# Patient Record
Sex: Male | Born: 1970 | Race: White | Hispanic: No
Health system: Southern US, Community
[De-identification: ages and names within clinical notes are randomized; demographics above are authoritative.]

---

## 2012-01-25 ENCOUNTER — Emergency Department: Payer: Self-pay | Admitting: Emergency Medicine

## 2020-04-13 ENCOUNTER — Ambulatory Visit: Payer: Self-pay | Attending: Internal Medicine

## 2020-04-13 DIAGNOSIS — Z23 Encounter for immunization: Secondary | ICD-10-CM

## 2020-04-13 NOTE — Progress Notes (Signed)
   Covid-19 Vaccination Clinic  Name:  Seth Clark    MRN: [id number] DOB: [birth year]  04/13/2020  [name] was observed post Covid-19 immunization for 15 minutes without incident. He was provided with Vaccine Information Sheet and instruction to access the V-Safe system.   [name] was instructed to call 911 with any severe reactions post vaccine: Marland Kitchen Difficulty breathing  . Swelling of face and throat  . A fast heartbeat  . A bad rash all over body  . Dizziness and weakness   Immunizations Administered    Name Date Dose VIS Date Route   Pfizer COVID-19 Vaccine 04/13/2020  9:02 AM 0.3 mL 02/16/2019 Intramuscular   Manufacturer: [employer], [employer]   Lot: [id number]   NDC: [address]-7

## 2020-05-09 ENCOUNTER — Ambulatory Visit: Payer: Self-pay | Attending: Internal Medicine

## 2021-07-26 ENCOUNTER — Emergency Department
Admission: EM | Admit: 2021-07-26 | Discharge: 2021-07-26 | Disposition: A | Discharge status: Home / Self Care | Payer: Self-pay | Attending: Emergency Medicine | Admitting: Emergency Medicine

## 2021-07-26 ENCOUNTER — Other Ambulatory Visit: Payer: Self-pay

## 2021-07-26 ENCOUNTER — Emergency Department: Payer: Self-pay

## 2021-07-26 DIAGNOSIS — Z20822 Contact with and (suspected) exposure to covid-19: Secondary | ICD-10-CM | POA: Insufficient documentation

## 2021-07-26 DIAGNOSIS — F17210 Nicotine dependence, cigarettes, uncomplicated: Secondary | ICD-10-CM | POA: Insufficient documentation

## 2021-07-26 DIAGNOSIS — J069 Acute upper respiratory infection, unspecified: Secondary | ICD-10-CM | POA: Insufficient documentation

## 2021-07-26 DIAGNOSIS — J209 Acute bronchitis, unspecified: Secondary | ICD-10-CM | POA: Insufficient documentation

## 2021-07-26 LAB — RESP PANEL BY RT-PCR (FLU A&B, COVID) ARPGX2
Influenza A by PCR: NEGATIVE
Influenza B by PCR: NEGATIVE
SARS Coronavirus 2 by RT PCR: NEGATIVE

## 2021-07-26 MED ORDER — AZITHROMYCIN 250 MG PO TABS: 250.0000 mg | ORAL_TABLET | Freq: Every day | ORAL | 0 refills | Status: DC

## 2021-07-26 MED ORDER — AZITHROMYCIN 250 MG PO TABS: 250.0000 mg | ORAL_TABLET | Freq: Every day | ORAL | 0 refills | Status: AC

## 2021-07-26 MED ORDER — AZITHROMYCIN 500 MG PO TABS
500.0000 mg | ORAL_TABLET | Freq: Once | ORAL | Status: AC
  Administered 2021-07-26: 500 mg via ORAL
  Filled 2021-07-26: qty 1

## 2021-07-26 NOTE — ED Provider Notes (Signed)
[facility] Emergency Department Provider Note ____________________________________________  Time seen: 1232  I have reviewed the triage vital signs and the nursing notes.  HISTORY  Chief Complaint  Fever and Cough   HPI Seth Clark is a 50 y.o. male presents self to the ED for evaluation of cough and fever for the last week.  Patient also describes decreased appetite and at same time.  He reports T-max at home 101.8 F.  Patient has taken 3 Tylenol today, prior to arrival.  He denies any diarrhea, belly pain, chest pain, or shortness of breath.  History reviewed. No pertinent past medical history.  There are no problems to display for this patient.   History reviewed. No pertinent surgical history.  Prior to Admission medications   Medication Sig Start Date End Date Taking? Authorizing Provider  azithromycin (ZITHROMAX Z-PAK) 250 MG tablet Take 1 tablet (250 mg total) by mouth daily for 4 days. 07/27/21 07/31/21  Seth Clark, Seth Ivory, PA-C    Allergies Patient has no allergy information on record.  History reviewed. No pertinent family history.  Social History Social History   Tobacco Use   Smoking status: Every Day    Types: Cigarettes   Smokeless tobacco: Never  Substance Use Topics   Alcohol use: Yes   Drug use: Yes    Types: Marijuana    Review of Systems  Constitutional: Positive for fever. Eyes: Negative for visual changes. ENT: Negative for sore throat. Cardiovascular: Negative for chest pain. Respiratory: Negative for shortness of breath.  Reports cough Gastrointestinal: Negative for abdominal pain, vomiting and diarrhea. Genitourinary: Negative for dysuria. Musculoskeletal: Negative for back pain. Skin: Negative for rash. Neurological: Negative for headaches, focal weakness or numbness. ____________________________________________  PHYSICAL EXAM:  VITAL SIGNS: ED Triage Vitals  Enc Vitals Group     BP 07/26/21 1057 (!)  121/92     Pulse Rate 07/26/21 1057 (!) 113     Resp 07/26/21 1057 18     Temp 07/26/21 1057 98.4 F (36.9 C)     Temp Source 07/26/21 1057 Oral     SpO2 07/26/21 1057 97 %     Weight 07/26/21 1058 175 lb (79.4 kg)     Height 07/26/21 1058 6' (1.829 m)     Head Circumference --      Peak Flow --      Pain Score 07/26/21 1058 0     Pain Loc --      Pain Edu? --      Excl. in GC? --     Constitutional: Alert and oriented. Well appearing and in no distress. Head: Normocephalic and atraumatic. Eyes: Conjunctivae are normal. Normal extraocular movements Ears: Canals clear. TMs intact bilaterally. Nose: No congestion/rhinorrhea/epistaxis. Mouth/Throat: Mucous membranes are moist. Cardiovascular: Normal rate, regular rhythm. Normal distal pulses. Respiratory: Normal respiratory effort. No wheezes/rales/rhonchi. Gastrointestinal: Soft and nontender. No distention. Musculoskeletal: Nontender with normal range of motion in all extremities.  Neurologic:  Normal gait without ataxia. Normal speech and language. No gross focal neurologic deficits are appreciated. Skin:  Skin is warm, dry and intact. No rash noted. Psychiatric: Mood and affect are normal. Patient exhibits appropriate insight and judgment. ____________________________________________   LABS (pertinent positives/negatives) Labs Reviewed  RESP PANEL BY RT-PCR (FLU A&B, COVID) [id number]    ____________________________________________  {EKG  ____________________________________________   RADIOLOGY  Official radiology report(s): DG Chest 2 View  Result Date: 07/26/2021 CLINICAL DATA:  Cough, fever and body aches for approximately 7 days. EXAM: CHEST -  2 VIEW COMPARISON:  None. FINDINGS: Lungs clear. Heart size normal. No pneumothorax or pleural fluid. No bony abnormality. IMPRESSION: Normal chest. Electronically Signed   By: Seth Clark M.D.   On: 07/26/2021 14:48    ____________________________________________  PROCEDURES  Azithromycin 500 mg PO  Procedures ____________________________________________   INITIAL IMPRESSION / ASSESSMENT AND PLAN / ED COURSE  As part of my medical decision making, I reviewed the following data within the electronic MEDICAL RECORD NUMBER Labs reviewed WNL, Radiograph reviewed WNL, and Notes from prior ED visits    DDX: viral URI, Covid, influenza, CAP    ED evaluation of 1 week of intermittent fevers and cough.  Evaluated in the ED for his complaints, and found to have no evidence of CAP or evidence of infection with influenza or COVID.  Clinically the patient is stable with normalizing heart rate and has been afebrile throughout his course.  He will be treated empirically for an infectious pneumonia with a course of azithromycin.  Patient will continue to monitor and treat any fevers, hydrate, and rest as appropriate.  Work note is provided with benefit.  Return precautions have been discussed.  Seth Clark was evaluated in Emergency Department on 07/27/2021 for the symptoms described in the history of present illness. He was evaluated in the context of the global COVID-19 pandemic, which necessitated consideration that the patient might be at risk for infection with the SARS-CoV-2 virus that causes COVID-19. Institutional protocols and algorithms that pertain to the evaluation of patients at risk for COVID-19 are in a state of rapid change based on information released by regulatory bodies including the CDC and federal and state organizations. These policies and algorithms were followed during the patient's care in the ED. ____________________________________________  FINAL CLINICAL IMPRESSION(S) / ED DIAGNOSES  Final diagnoses:  Viral URI with cough  Acute bronchitis, unspecified organism      Seth Hoard, PA-C 07/27/21 1128    Seth Katos, MD 07/27/21 1524

## 2021-07-26 NOTE — Discharge Instructions (Addendum)
Your exam and viral test are negative at this time.  Take the antibiotic as directed.  Consider taking over-the-counter cough medicine for cough relief.  Follow-up with primary provider or [facility] as needed peer return to the ED if needed.

## 2021-07-26 NOTE — ED Triage Notes (Signed)
Pt arrived to ed via pov from home w/ c/o fever and cough , decreased appetite over last week. Pt reports highest temp at home was 101.8. pt report taking 3 tylenol unknown mg. NAD noted at this time

## 2021-07-26 NOTE — ED Notes (Signed)
See triage note  Presents with fever,h/a and body aches    States sx's started about 7 days ago  Fever at home was 101  Pt is currently afebrile

## 2022-07-21 ENCOUNTER — Encounter: Payer: Self-pay | Admitting: Emergency Medicine

## 2022-07-21 ENCOUNTER — Emergency Department
Admission: EM | Admit: 2022-07-21 | Discharge: 2022-07-21 | Disposition: A | Discharge status: Home / Self Care | Payer: Self-pay | Attending: Emergency Medicine | Admitting: Emergency Medicine

## 2022-07-21 ENCOUNTER — Emergency Department: Payer: Self-pay

## 2022-07-21 ENCOUNTER — Other Ambulatory Visit: Payer: Self-pay

## 2022-07-21 DIAGNOSIS — T148XXA Other injury of unspecified body region, initial encounter: Secondary | ICD-10-CM

## 2022-07-21 DIAGNOSIS — X58XXXA Exposure to other specified factors, initial encounter: Secondary | ICD-10-CM | POA: Insufficient documentation

## 2022-07-21 DIAGNOSIS — S300XXA Contusion of lower back and pelvis, initial encounter: Secondary | ICD-10-CM | POA: Insufficient documentation

## 2022-07-21 LAB — COMPREHENSIVE METABOLIC PANEL
ALT: 39 U/L (ref 0–44)
AST: 39 U/L (ref 15–41)
Albumin: 4.3 g/dL (ref 3.5–5.0)
Alkaline Phosphatase: 49 U/L (ref 38–126)
Anion gap: 5 (ref 5–15)
BUN: 13 mg/dL (ref 6–20)
CO2: 25 mmol/L (ref 22–32)
Calcium: 8.6 mg/dL — ABNORMAL LOW (ref 8.9–10.3)
Chloride: 106 mmol/L (ref 98–111)
Creatinine, Ser: 0.84 mg/dL (ref 0.61–1.24)
GFR, Estimated: >60 mL/min (ref >60)
Glucose, Bld: 111 mg/dL — ABNORMAL HIGH (ref 70–99)
Potassium: 4.3 mmol/L (ref 3.5–5.1)
Sodium: 136 mmol/L (ref 135–145)
Total Bilirubin: 0.8 mg/dL (ref 0.3–1.2)
Total Protein: 7.4 g/dL (ref 6.5–8.1)

## 2022-07-21 LAB — URINALYSIS, ROUTINE W REFLEX MICROSCOPIC
Bilirubin Urine: NEGATIVE
Glucose, UA: NEGATIVE mg/dL
Hgb urine dipstick: NEGATIVE
Ketones, ur: NEGATIVE mg/dL
Leukocytes,Ua: NEGATIVE
Nitrite: NEGATIVE
Protein, ur: NEGATIVE mg/dL
Specific Gravity, Urine: 1.019 (ref 1.005–1.030)
pH: 7 (ref 5.0–8.0)

## 2022-07-21 LAB — PROTIME-INR
INR: 1 (ref 0.8–1.2)
Prothrombin Time: 12.8 seconds (ref 11.4–15.2)

## 2022-07-21 LAB — CBC WITH DIFFERENTIAL/PLATELET
Abs Immature Granulocytes: 0.02 10*3/uL (ref 0.00–0.07)
Basophils Absolute: 0.1 10*3/uL (ref 0.0–0.1)
Basophils Relative: 1 %
Eosinophils Absolute: 0 10*3/uL (ref 0.0–0.5)
Eosinophils Relative: 0 %
HCT: 45.6 % (ref 39.0–52.0)
Hemoglobin: 15 g/dL (ref 13.0–17.0)
Immature Granulocytes: 0 %
Lymphocytes Relative: 26 %
Lymphs Abs: 2.4 10*3/uL (ref 0.7–4.0)
MCH: 30.9 pg (ref 26.0–34.0)
MCHC: 32.9 g/dL (ref 30.0–36.0)
MCV: 94 fL (ref 80.0–100.0)
Monocytes Absolute: 0.6 10*3/uL (ref 0.1–1.0)
Monocytes Relative: 6 %
Neutro Abs: 6.1 10*3/uL (ref 1.7–7.7)
Neutrophils Relative %: 67 %
Platelets: 249 10*3/uL (ref 150–400)
RBC: 4.85 MIL/uL (ref 4.22–5.81)
RDW: 12.4 % (ref 11.5–15.5)
WBC: 9.2 10*3/uL (ref 4.0–10.5)
nRBC: 0 % (ref 0.0–0.2)

## 2022-07-21 LAB — SAMPLE TO BLOOD BANK

## 2022-07-21 LAB — LACTIC ACID, PLASMA: Lactic Acid, Venous: 1 mmol/L (ref 0.5–1.9)

## 2022-07-21 MED ORDER — IOHEXOL 300 MG/ML  SOLN
100.0000 mL | Freq: Once | INTRAMUSCULAR | Status: AC | PRN
  Administered 2022-07-21: 100 mL via INTRAVENOUS

## 2022-07-21 MED ORDER — IOHEXOL 350 MG/ML SOLN: 100.0000 mL | Freq: Once | INTRAVENOUS | Status: DC | PRN

## 2022-07-21 NOTE — ED Provider Notes (Signed)
I assumed care of this patient approximately 1500.  Please see outgoing providers note for full details guarding patient's initial evaluation assessment.  In brief patient presents for evaluation of 2 days of worsening achiness and some skin discoloration in his lower bilateral buttocks region extending towards his perineal region.  He is afebrile hemodynamically stable on arrival.  No history of recent trauma although he did have recent intercourse.  At time of signout he has labs obtained including a UA that this shows no evidence of infection, CBC without leukocytosis or acute anemia normal platelets, nonelevated lactic acid, and CMP without any significant lecture light or metabolic derangements.  He is pending a CT pelvis.  Clinically findings are concerning for hematoma likely from a ruptured vein and possibly a skin tear.  CT pelvis shows some soft tissue edema and stranding in the right inferomedial buttocks and perineum without any evidence of fluid collection.  There is no gas.  Somewhat nonspecific.  There is evidence of a small left hydrocele per radiology and some diffuse bladder stranding which is nonspecific and overall given on remarkable UA I do not think represents an clinically significant cystitis.  I suspect based on history and based on my exam with the patient showing what appears to be a large hematoma patient had spontaneous vessel rupture possibly with a muscle strain.  We will mark the area today with a marking pen.  Advised pelvic rest for close outpatient PCP follow-up.  Discussed returning for any increase in size or new symptoms.  He is agreeable with plan.  Discharged in stable condition.  Strict return precautions advised and discussed.    Gilles Chiquito, MD 07/21/22 [phone]

## 2022-07-21 NOTE — ED Triage Notes (Signed)
Pt via POV from home. Pt c/o a hematoma and bruising that spans from his buttocks, rectum into his scrotum and groin this AM. Denies any injury. States that he does have some burning when he pees and swelling in his testicles that started today. States that yesterday he felt a small "lump" on his rectum. Denies any blood thinners. Pt is A&OX4 and NAD

## 2022-07-21 NOTE — ED Provider Notes (Signed)
[facility] Provider Note    Event Date/Time   First MD Initiated Contact with Patient 07/21/22 1239     (approximate)   History   Abscess   HPI  Seth Clark is a 51 y.o. 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  It hurt for a few hours but then went away.  Then yesterday he and his fiance had normal intercourse after use of Viagra, he denies any injury sustained doing so.  This morning while watching videogames he notes he felt like a strange sort of slight achiness around his buttock area, he took a photo and noticed the area was quite dark or bruised.  No fevers or chills.  No nausea or vomiting.  There is no bruising or swelling of the penis or scrotum around the testicles.  Reports the bruising is just below the scrotum and runs up towards his buttock.  He also reports he felt a small area or a lump kind of near the edge of his buttock or rectum.  Denies any falls or trauma.  Does report about 2 days ago he felt like he pulled something in his groin area and it was sore but got better     Physical Exam   Triage Vital Signs: ED Triage Vitals  Enc Vitals Group     BP 07/21/22 1227 (!) 115/94     Pulse Rate 07/21/22 1227 94     Resp 07/21/22 1227 20     Temp 07/21/22 1227 98.4 F (36.9 C)     Temp src --      SpO2 07/21/22 1227 98 %     Weight 07/21/22 1226 170 lb (77.1 kg)     Height 07/21/22 1226 6\' 1"  (1.854 m)     Head Circumference --      Peak Flow --      Pain Score 07/21/22 1225 6     Pain Loc --      Pain Edu? --      Excl. in GC? --     Most recent vital signs: Vitals:   07/21/22 1227  BP: (!) 115/94  Pulse: 94  Resp: 20  Temp: 98.4 F (36.9 C)  SpO2: 98%     General: Awake, no distress.   CV:  Good peripheral perfusion.  Resp:  Normal effort.  Abd:  No distention.  Soft nontender nondistended throughout. Other:  Examined in the perineum has mild ecchymosis and also noticed his ecchymosis over the gluteal cleft bilaterally.  This is ecchymosis extends across the perineum up to the base of the scrotum but does not enter into the scrotum.  Testicles are nontender and there is no noted testicular edema.  The penis is normal, nondirect uncircumcised and atraumatic.  No bruising around the penis itself.  In essence he has what appears to be ecchymosis over his perineum.  The area is only slightly tender and perhaps a very minimally swollen.  No areas of erythema or skin breakdown.  Ecchymosis does not extend to the rectum and seems to slightly spare the gluteal cleft.   ED Results / Procedures / Treatments   Labs (all labs ordered are listed, but only abnormal results are displayed) Labs Reviewed  COMPREHENSIVE METABOLIC  PANEL - Abnormal; Notable for the following components:      Result Value   Glucose, Bld 111 (*)    Calcium 8.6 (*)    All other components within normal limits  URINALYSIS, ROUTINE W REFLEX MICROSCOPIC - Abnormal; Notable for the following components:   Color, Urine YELLOW (*)    APPearance CLEAR (*)    All other components within normal limits  CULTURE, BLOOD (ROUTINE X 2)  CULTURE, BLOOD (ROUTINE X 2)  LACTIC ACID, PLASMA  CBC WITH DIFFERENTIAL/PLATELET  PROTIME-INR  SAMPLE TO BLOOD BANK     EKG     RADIOLOGY  CT pelvis pending at time of signout   PROCEDURES:  Critical Care performed: No  Procedures   MEDICATIONS ORDERED IN ED: Medications  iohexol (OMNIPAQUE) 300 MG/ML solution 100 mL (100 mLs Intravenous Contrast Given 07/21/22 1546)     IMPRESSION / MDM / ASSESSMENT AND PLAN / ED COURSE  I reviewed the triage vital signs and the nursing notes.                              Differential diagnosis includes, but is not limited  to, possible musculoskeletal strain resulting in hematoma formation in around the perineum, abscess though this seems unlikely without fever normal white count and the clinical history given.  There is no evidence that would be suggestive of Fournier's type gangrene.  He denies any major trauma but does report a possible muscle tear occurring the day before and the swelling being in a similar area making me think possibly musculoskeletal or from a strain.  No involvement of the testicles scrotum or penis.  CT ordered to evaluate for pelvic pathology and etiology rule out possible abscess or large hematoma.  Patient's presentation is most consistent with acute complicated illness / injury requiring diagnostic workup.  Labs interpreted as normal with exception to very mild hypocalcemia.  Normal CBC normal hemoglobin normal white count.  Urinalysis without evidence of infection  Ongoing care assigned to Dr. Terrilee Files, follow-up on pending CT of the pelvis to further evaluate for etiology of bruising and swelling.  My suspicion is that this may be musculoskeletal in nature, but do wish to rule out other causes such as possible infectious, fournier, abscess, large hematoma etc.     FINAL CLINICAL IMPRESSION(S) / ED DIAGNOSES   Final diagnoses:  Bruising  Ecchymosis, perineum   Rx / DC Orders   ED Discharge Orders     None        Note:  This document was prepared using Dragon voice recognition software and may include unintentional dictation errors.   Sharyn Creamer, MD 07/21/22 1620

## 2022-07-26 LAB — CULTURE, BLOOD (ROUTINE X 2)
Culture: NO GROWTH
Special Requests: ADEQUATE

## 2022-09-11 IMAGING — CR DG CHEST 2V
1 series · 2 of 2 positions shown · non-contrast
Comparison: None.

CLINICAL DATA: Cough, fever and body aches for approximately 7
days.

EXAM:
CHEST - 2 VIEW

[Series 1: dg chest 2 view · 0.14mm/px · 2 of 2 slices shown]
[im 1/2]
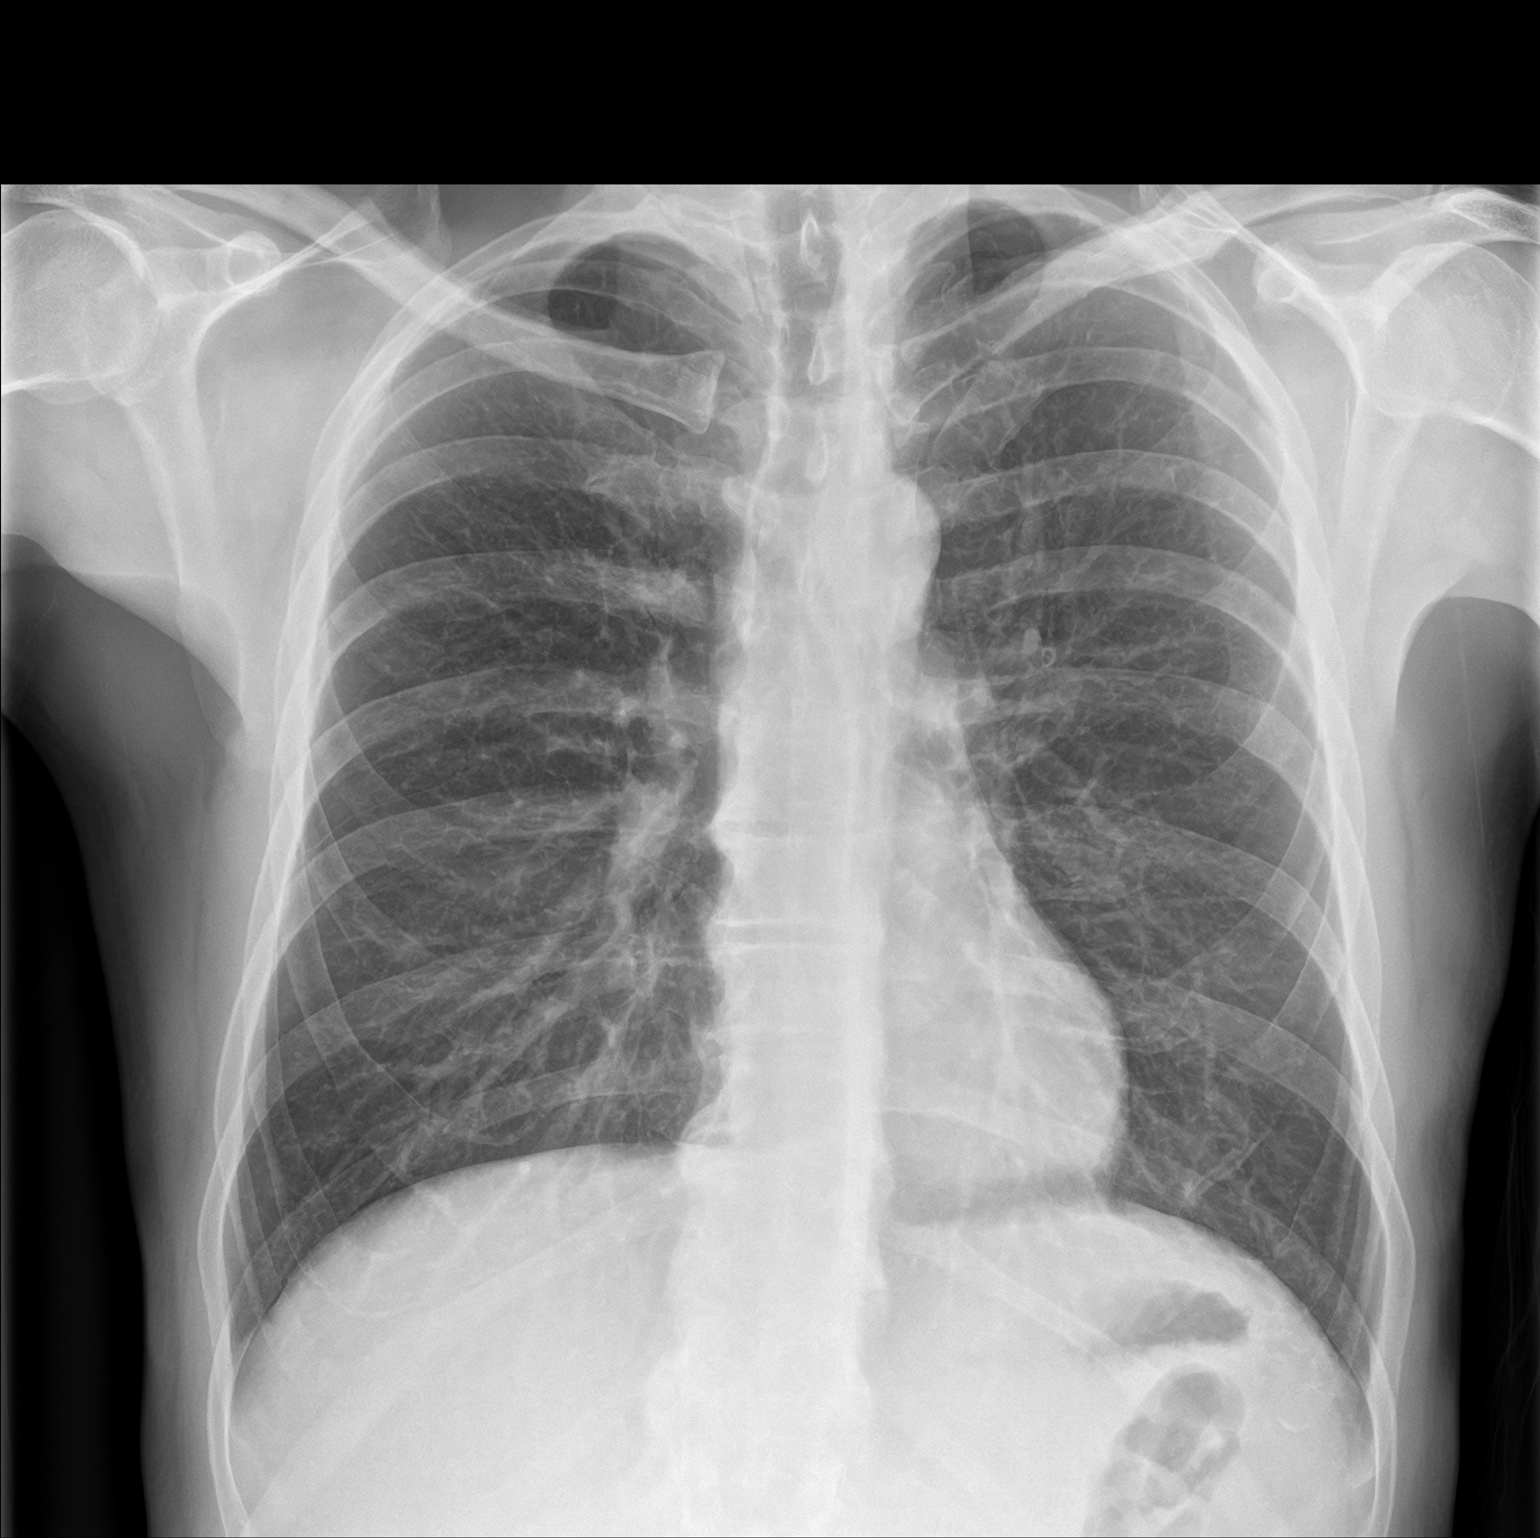
[im 2/2]
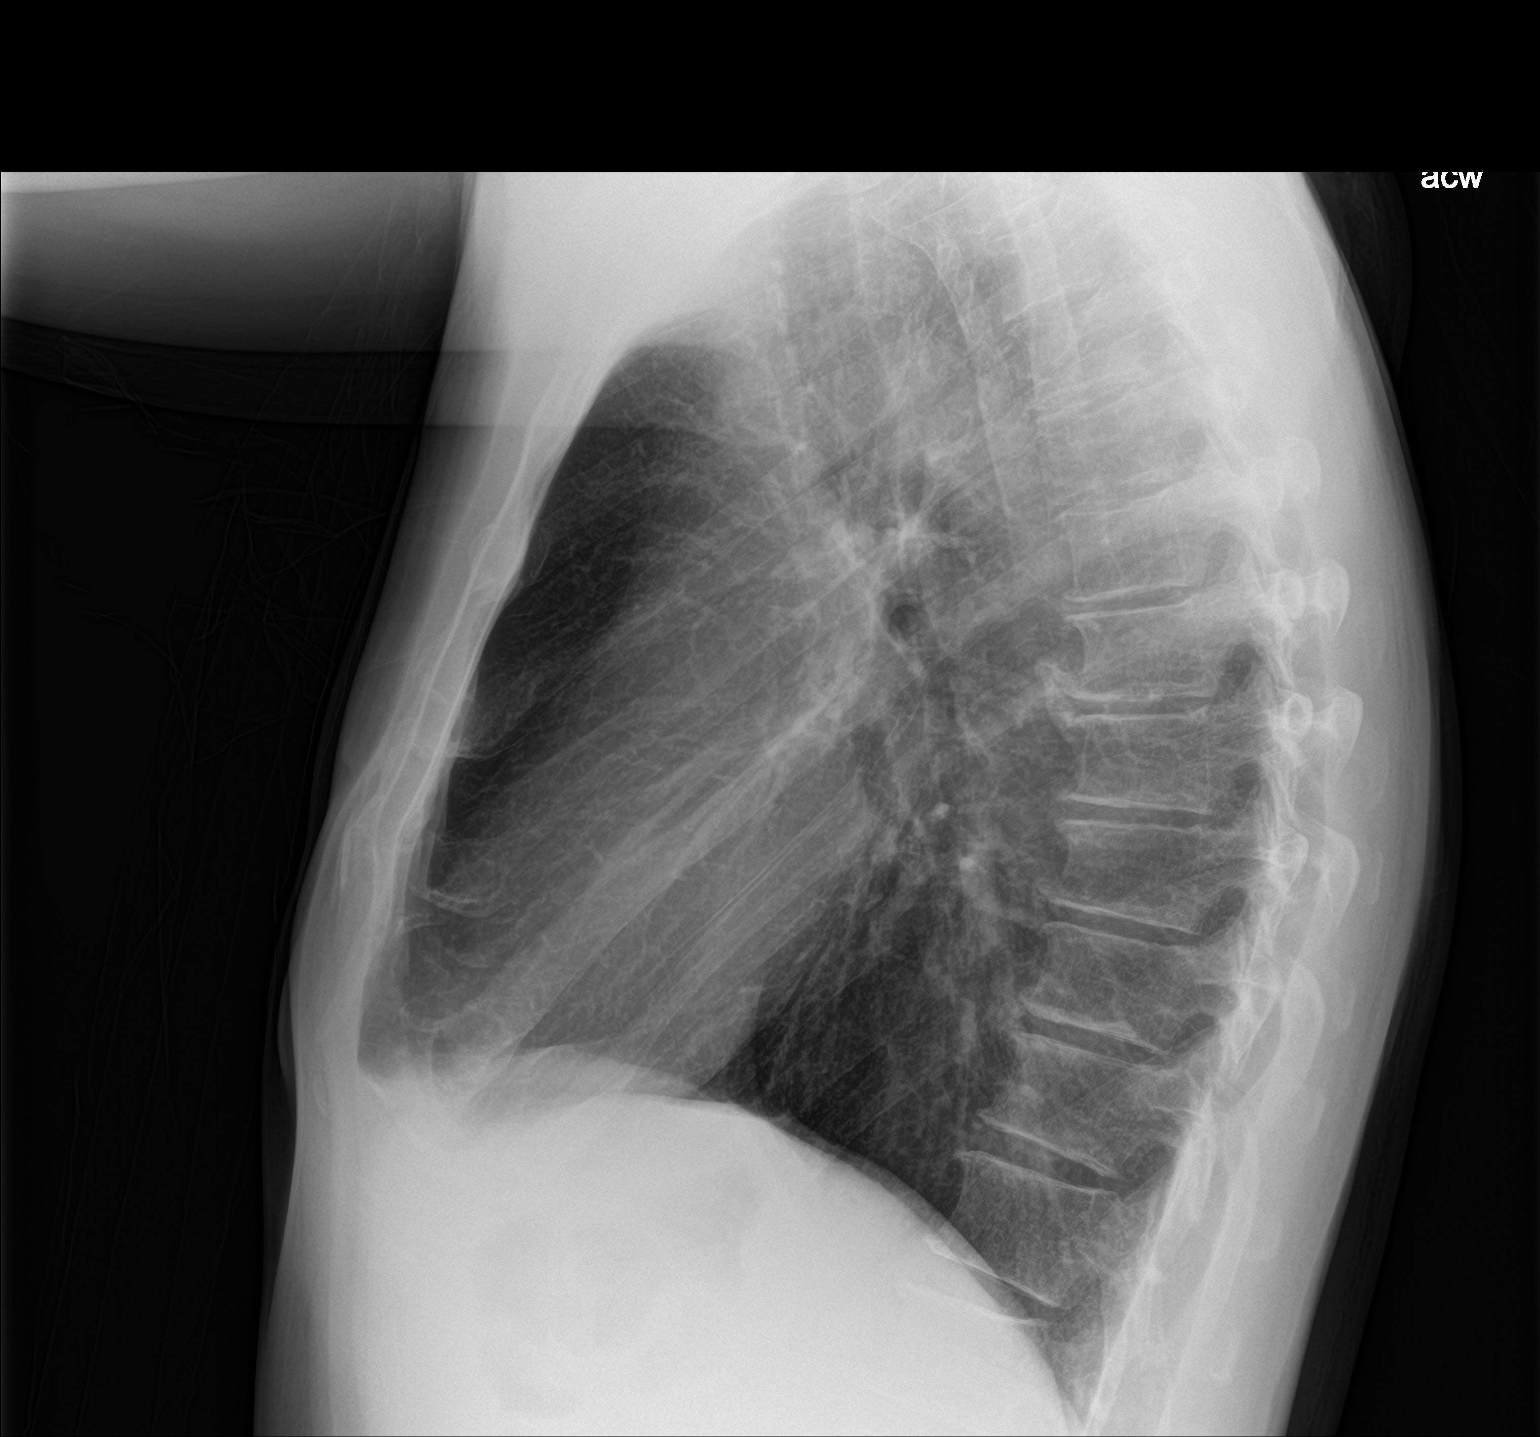

[2 of 2 positions shown; findings below may reference images not displayed]

FINDINGS: Lungs clear. Heart size normal. No pneumothorax or pleural fluid. No
bony abnormality.
IMPRESSION: Normal chest.
